# Patient Record
Sex: Male | Born: 1945 | Race: Black or African American | Hispanic: No | Marital: Married | Smoking: Never smoker
Health system: Southern US, Community
[De-identification: ages and names within clinical notes are randomized; demographics above are authoritative.]

## PROBLEM LIST (undated history)

## (undated) DIAGNOSIS — E119 Type 2 diabetes mellitus without complications: Secondary | ICD-10-CM

## (undated) DIAGNOSIS — N289 Disorder of kidney and ureter, unspecified: Secondary | ICD-10-CM

## (undated) DIAGNOSIS — I10 Essential (primary) hypertension: Secondary | ICD-10-CM

---

## 2000-12-07 ENCOUNTER — Ambulatory Visit (HOSPITAL_COMMUNITY): Admission: RE | Admit: 2000-12-07 | Discharge: 2000-12-07 | Payer: Self-pay | Admitting: Ophthalmology

## 2000-12-07 ENCOUNTER — Encounter: Payer: Self-pay | Admitting: Ophthalmology

## 2009-03-17 ENCOUNTER — Emergency Department (HOSPITAL_BASED_OUTPATIENT_CLINIC_OR_DEPARTMENT_OTHER): Admission: EM | Admit: 2009-03-17 | Discharge: 2009-03-17 | Payer: Self-pay | Admitting: Emergency Medicine

## 2010-08-02 NOTE — Op Note (Signed)
[place]. [facility]  Patient:    Colton Peterson, Colton Peterson Visit Number: [id number] MRN: [id number]          Service Type: Attending:  Ernesto Rutherford, M.D. Dictated by:   Ernesto Rutherford, M.D. Proc. Date: 12/07/00                             Operative Report  PREOPERATIVE DIAGNOSES: 1. Dense vitreous hemorrhage, left eye. 2. 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  POSTOPERATIVE DIAGNOSES: 1. Dense vitreous hemorrhage, left eye. 2. 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  PROCEDURE:  Posterior vitrectomy with Endolaser panphotocoagulation, left eye.  SURGEON:  Ernesto Rutherford, M.D.  ANESTHESIA:  General endotracheal anesthesia.  INDICATION FOR PROCEDURE:  A 65 year old man who has had progressive proliferative diabetic retinopathy which has led to vitreous hemorrhage which is not clearing.  This is an attempt to clear vitreous opacification so as to allow for maximal visual acuity recovery and reinstitution of visual functioning.  The patient understands the risks of anesthesia, including the rare occurrence of death, and loss to the eye, including hemorrhage, infection, scarring, need for further surgery, no change in vision, loss of vision, and progression of disease despite intervention.  An appropriate signed consent was obtained.  DESCRIPTION OF PROCEDURE:  The patient was taken to the operating room. Planned local retrobulbar anesthesia with monitored anesthesia control was started; however, the patient became somnolent and had low respiratory rate, and thus nasal airway placed followed by a decision to switch to general endotracheal anesthesia for safety.  This was carried out without difficulty. The left periocular region was sterilely prepped and draped in the usual ophthalmic fashion.  Lid speculum applied.  Conjunctival peritomy fashioned temporally and superonasally.  A 4 mm infusion was  secured 4 mm posterior to the limbus in the inferotemporal quadrant.  Placement in the vitreous cavity verified visually.  Superior sclerotomy was then fashioned.  Wall microscope placed in position with BIOM attached.  Core vitrectomy was then begun. Vitreous was engaged and dense vitreous hemorrhage removed.  Neovascular tissue was noted temporally, and this was excised as well with vitrectomy instrumentation.  Vitreous skirt elevated 360 degrees.  Vitreous opacification removed.  Preretinal hemorrhage was aspirated.  Endolaser photocoagulation was then carried out posteriorly and peripherally.  Hemostasis was otherwise spontaneous.  The instruments were removed from the eye.  The superior sclerotomies were closed with 7-0 Vicryl suture.  The infusion was removed and the sclerotomy closed with 7-0 Vicryl suture.  Subconjunctival injections were placed inferiorly after the conjunctiva had been closed with 7-0 Vicryl suture.  Sterile patch and Fox shield applied.  The patient awakened from anesthesia and taken to the recovery room in good and stable condition. Dictated by:   Ernesto Rutherford, M.D. Attending:  Ernesto Rutherford, M.D. DD:  12/07/00 TD:  12/08/00 Job: [id number]

## 2018-09-01 ENCOUNTER — Encounter (HOSPITAL_BASED_OUTPATIENT_CLINIC_OR_DEPARTMENT_OTHER): Payer: Self-pay | Admitting: Student

## 2018-09-01 ENCOUNTER — Emergency Department (HOSPITAL_BASED_OUTPATIENT_CLINIC_OR_DEPARTMENT_OTHER)
Admission: EM | Admit: 2018-09-01 | Discharge: 2018-09-01 | Disposition: A | Discharge status: Home / Self Care | Payer: Medicare PPO | Attending: Emergency Medicine | Admitting: Emergency Medicine

## 2018-09-01 ENCOUNTER — Other Ambulatory Visit: Payer: Self-pay

## 2018-09-01 ENCOUNTER — Emergency Department (HOSPITAL_BASED_OUTPATIENT_CLINIC_OR_DEPARTMENT_OTHER): Payer: Medicare PPO

## 2018-09-01 DIAGNOSIS — R51 Headache: Secondary | ICD-10-CM | POA: Insufficient documentation

## 2018-09-01 DIAGNOSIS — W19XXXA Unspecified fall, initial encounter: Secondary | ICD-10-CM

## 2018-09-01 DIAGNOSIS — Y999 Unspecified external cause status: Secondary | ICD-10-CM | POA: Insufficient documentation

## 2018-09-01 DIAGNOSIS — S8992XA Unspecified injury of left lower leg, initial encounter: Secondary | ICD-10-CM | POA: Diagnosis present

## 2018-09-01 DIAGNOSIS — W109XXA Fall (on) (from) unspecified stairs and steps, initial encounter: Secondary | ICD-10-CM | POA: Diagnosis not present

## 2018-09-01 DIAGNOSIS — M25552 Pain in left hip: Secondary | ICD-10-CM | POA: Insufficient documentation

## 2018-09-01 DIAGNOSIS — I10 Essential (primary) hypertension: Secondary | ICD-10-CM | POA: Insufficient documentation

## 2018-09-01 DIAGNOSIS — E119 Type 2 diabetes mellitus without complications: Secondary | ICD-10-CM | POA: Diagnosis not present

## 2018-09-01 DIAGNOSIS — Y9301 Activity, walking, marching and hiking: Secondary | ICD-10-CM | POA: Insufficient documentation

## 2018-09-01 DIAGNOSIS — Z23 Encounter for immunization: Secondary | ICD-10-CM | POA: Insufficient documentation

## 2018-09-01 DIAGNOSIS — S80212A Abrasion, left knee, initial encounter: Secondary | ICD-10-CM | POA: Insufficient documentation

## 2018-09-01 DIAGNOSIS — Y929 Unspecified place or not applicable: Secondary | ICD-10-CM | POA: Insufficient documentation

## 2018-09-01 HISTORY — DX: Disorder of kidney and ureter, unspecified: N28.9

## 2018-09-01 HISTORY — DX: Type 2 diabetes mellitus without complications: E11.9

## 2018-09-01 HISTORY — DX: Essential (primary) hypertension: I10

## 2018-09-01 MED ORDER — TETANUS-DIPHTH-ACELL PERTUSSIS 5-2.5-18.5 LF-MCG/0.5 IM SUSP
0.5000 mL | Freq: Once | INTRAMUSCULAR | Status: AC
  Administered 2018-09-01: 0.5 mL via INTRAMUSCULAR
  Filled 2018-09-01: qty 0.5

## 2018-09-01 MED ORDER — OXYCODONE-ACETAMINOPHEN 5-325 MG PO TABS
1.0000 | ORAL_TABLET | Freq: Once | ORAL | Status: AC
  Administered 2018-09-01: 1 via ORAL
  Filled 2018-09-01: qty 1

## 2018-09-01 NOTE — Discharge Instructions (Signed)
Please read and follow all provided instructions.  You have been seen today after a fall.  The CT scan of your head did not show a bleed or other acute abnormality.  The x-ray of your hips and knees did not show fracture or dislocation.  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  Home instructions: -- *PRICE in the first 24-48 hours after injury: Protect (with brace, splint, sling), if given by your provider Rest Ice- Do not apply ice pack directly to your skin, place towel or similar between your skin and ice/ice pack. Apply ice for 20 min, then remove for 40 min while awake Compression- Wear brace, elastic bandage, splint as directed by your provider Elevate affected extremity above the level of your heart when not walking around for the first 24-48 hours   Medications:  Please take Tylenol per over-the-counter dosing Follow-up instructions: Please follow-up with your primary care provider or the provided orthopedic physician (bone specialist) if you continue to have significant pain in 1 week. In this case you may have a more severe injury that requires further care.   Return instructions:  Please return if your digits or extremity are numb or tingling, appear gray or blue, or you have severe pain (also elevate the extremity and loosen splint or wrap if you were given one) Please return if you have redness or fevers.  Please return to the Emergency Department if you experience worsening symptoms.  Please return if you have any other emergent concerns. Additional Information:  Your vital signs today were: BP (!) 108/51 (BP Location: Left Arm)    Pulse (!) 57    Temp 98.8 F (37.1 C) (Oral)    Resp 18    Ht 5\' 8"  (1.727 m)    Wt 122.5 kg    SpO2 99%    BMI 41.05 kg/m  If your blood pressure (BP) was elevated above 135/85 this visit, please have this repeated by your doctor within one month. ---------------

## 2018-09-01 NOTE — ED Triage Notes (Signed)
Per [facility] patient c/o mechanical fall of three steps at house. Patient denies LOC or injury to head/neck/back. Patient does endorse pain lin left hip and side at 4/10. Patient is AO x 4 with VS WDL.

## 2018-09-01 NOTE — ED Provider Notes (Signed)
[facility] EMERGENCY DEPARTMENT Provider Note   CSN: [id number] Arrival date & time: 09/01/18  1937     History   Chief Complaint Chief Complaint  Patient presents with  . Fall    HPI Colton Peterson is a 73 y.o. male with a history of type 2 diabetes mellitus, hypertension, and hyperlipidemia who presents to the emergency department via EMS status post mechanical fall shortly prior to arrival with complaints of left hip pain.  Patient states he was walking down the steps too quickly when he misstepped resulting in a fall.  He reports head injury without loss of consciousness.  He notes he is having pain to the left hip as well as to the left knee where there is a skin abrasion.  His current discomfort is a 4 out of 10 in severity.  Worse with movement.  No alleviating factors.  He did not attempt to get up after the fall, has not ambulated since.  He denies headache, neck pain, back pain, nausea, vomiting, syncope, change in vision, numbness, or weakness.  He is not on anticoagulation.  He denies prodromal symptoms, no chest pain, dyspnea, lightheadedness, or dizziness, states this was purely mechanical.  He does not walk with an assistive device, he lives at home with his significant other.  Unknown last tetanus.    HPI  History reviewed. No pertinent past medical history.  There are no active problems to display for this patient.   History reviewed. No pertinent surgical history.      Home Medications    Prior to Admission medications   Not on File    Family History History reviewed. No pertinent family history.  Social History Social History   Tobacco Use  . Smoking status: Not on file  Substance Use Topics  . Alcohol use: Not on file  . Drug use: Not on file     Allergies   Penicillins   Review of Systems Review of Systems  Constitutional: Negative for chills and fever.  Eyes: Negative for visual disturbance.  Respiratory: Negative for  shortness of breath.   Cardiovascular: Negative for chest pain.  Gastrointestinal: Negative for nausea and vomiting.  Musculoskeletal: Positive for arthralgias. Negative for back pain and neck pain.  Skin: Positive for wound.  Neurological: Negative for dizziness, seizures, syncope, facial asymmetry, weakness, light-headedness, numbness and headaches.  All other systems reviewed and are negative.    Physical Exam Updated Vital Signs BP (!) 108/51 (BP Location: Left Arm)   Pulse (!) 57   Temp 98.8 F (37.1 C) (Oral)   Resp 18   Ht 5\' 8"  (1.727 m)   Wt 122.5 kg   SpO2 99%   BMI 41.05 kg/m   Physical Exam Vitals signs and nursing note reviewed.  Constitutional:      General: He is not in acute distress.    Appearance: He is well-developed. He is not toxic-appearing.  HENT:     Head: Normocephalic and atraumatic. No raccoon eyes or Battle's sign.     Right Ear: No hemotympanum.     Left Ear: No hemotympanum.     Nose: Nose normal.     Mouth/Throat:     Pharynx: Uvula midline.  Eyes:     General:        Right eye: No discharge.        Left eye: No discharge.     Extraocular Movements: Extraocular movements intact.     Conjunctiva/sclera: Conjunctivae normal.  Pupils: Pupils are equal, round, and reactive to light.  Neck:     Musculoskeletal: Normal range of motion and neck supple. No spinous process tenderness.  Cardiovascular:     Rate and Rhythm: Normal rate and regular rhythm.     Pulses:          Dorsalis pedis pulses are 2+ on the right side and 2+ on the left side.  Pulmonary:     Effort: Pulmonary effort is normal. No respiratory distress.     Breath sounds: Normal breath sounds. No wheezing, rhonchi or rales.  Chest:     Chest wall: No tenderness.  Abdominal:     General: There is no distension.     Palpations: Abdomen is soft.     Tenderness: There is no abdominal tenderness. There is no guarding or rebound.  Musculoskeletal:     Comments: Upper  extremities: Normal active range of motion without point/focal bony tenderness Back: No midline tenderness to palpation or palpable step-off Lower extremities: Patient noted to have symmetric 1-2+ pitting edema to the bilateral lower legs which patient states is baseline for him.  He is also noted to have an abrasion to the left anterior knee without active bleeding, no deep laceration wounds.  He has no obvious deformity or ecchymosis.  He has intact active range of motion to bilateral hips, knees, and ankles.  Tender to palpation to the left lateral hip in the area of the iliac crest as well as tender to the left anterior knee over the patella.  Lower extremities are otherwise nontender neurovascularly intact distally.  Skin:    General: Skin is warm and dry.     Findings: No rash.  Neurological:     Mental Status: He is alert.     Comments: Clear speech.  CN III through XII grossly intact.  Sensation grossly intact bilateral upper and lower extremities.  5 out of 5 symmetric grip strength.  5 out of 5 strength with plantar dorsiflexion bilaterally.  Trial of ambulation deferred on initial assessment.  Psychiatric:        Behavior: Behavior normal.    ED Treatments / Results  Labs (all labs ordered are listed, but only abnormal results are displayed) Labs Reviewed - No data to display  EKG    Radiology Ct Head Wo Contrast  Result Date: 09/01/2018 CLINICAL DATA:  Head trauma, headache EXAM: CT HEAD WITHOUT CONTRAST TECHNIQUE: Contiguous axial images were obtained from the base of the skull through the vertex without intravenous contrast. COMPARISON:  None. FINDINGS: Brain: No acute territorial infarction, hemorrhage or intracranial mass. The ventricles are nonenlarged. Moderate atrophy. Mild small vessel ischemic changes of the white matter. Vascular: No hyperdense vessels.  Carotid vascular calcification Skull: Normal. Negative for fracture or focal lesion. Sinuses/Orbits: No acute finding.  Mild mucosal thickening in the right maxillary sinus Other: None IMPRESSION: 1. No CT evidence for acute intracranial abnormality. 2. Atrophy and minimal small vessel ischemic changes of the white matter Electronically Signed   By: Donavan Foil M.D.   On: 09/01/2018 21:15   Dg Knee Complete 4 Views Left  Result Date: 09/01/2018 CLINICAL DATA:  Fall with knee pain EXAM: LEFT KNEE - COMPLETE 4+ VIEW COMPARISON:  None. FINDINGS: No acute displaced fracture or malalignment. No large knee effusion. Vascular calcifications. Mild degenerative change of the medial joint space. IMPRESSION: No acute osseous abnormality. Electronically Signed   By: Donavan Foil M.D.   On: 09/01/2018 21:17   Dg Hip  Unilat With Pelvis 2-3 Views Left  Result Date: 09/01/2018 CLINICAL DATA:  Mechanical fall EXAM: DG HIP (WITH OR WITHOUT PELVIS) 2-3V LEFT COMPARISON:  None. FINDINGS: Moderate fecal debris in the rectum. Pubic symphysis and rami are intact. Moderate arthritis of both hips. No fracture or malalignment. IMPRESSION: 1. No acute osseous abnormality. 2. Moderate arthritis of both hips. Electronically Signed   By: Jasmine PangKim  Fujinaga M.D.   On: 09/01/2018 21:16    Procedures Procedures (including critical care time)  Medications Ordered in ED Medications  oxyCODONE-acetaminophen (PERCOCET/ROXICET) 5-325 MG per tablet 1 tablet (1 tablet Oral Given 09/01/18 2028)  Tdap (BOOSTRIX) injection 0.5 mL (0.5 mLs Intramuscular Given 09/01/18 2132)     Initial Impression / Assessment and Plan / ED Course  I have reviewed the triage vital signs and the nursing notes.  Pertinent labs & imaging results that were available during my care of the patient were reviewed by me and considered in my medical decision making (see chart for details).   Patient presents to the emergency department status post mechanical fall, no prodromal symptoms, with complaints of left hip and knee pain.  Patient nontoxic-appearing, no apparent distress,  vitals without significant abnormality.  He does not appear to have serious head, neck, or back injury.  He is requesting a CT of his head which was obtained and negative for acute intracranial abnormality.  No midline spinal tenderness or focal neurologic deficits.  No chest or abdominal tenderness to indicate acute intrathoracic/abdominal/pelvic trauma.  He does have tenderness to the lateral aspect of the left hip in the anterior left knee, left knee with small skin abrasion that does not appear to require closure-tetanus updated, good range of motion to each of these joints, neurovascularly intact distally.  Plain films negative for fracture or dislocation.  Hip arthritis present bilaterally. Patient able to ambulate & bear weight bilaterally. Will discharge home with instructions for PRICE & tylenol PRN pain. I discussed results, treatment plan, need for follow-up, and return precautions with the patient. Provided opportunity for questions, patient confirmed understanding and is in agreement with plan.   Findings and plan of care discussed with supervising physician Dr. Anitra LauthPlunkett who evaluated patient & is in agreement.   Final Clinical Impressions(s) / ED Diagnoses   Final diagnoses:  Fall, initial encounter    ED Discharge Orders    None       Desmond Lopeetrucelli, Terral Cooks R, PA-C 09/01/18 2154    Gwyneth SproutPlunkett, Whitney, MD 09/01/18 2244

## 2019-04-30 ENCOUNTER — Ambulatory Visit: Payer: Medicare PPO | Attending: Internal Medicine

## 2019-04-30 DIAGNOSIS — Z23 Encounter for immunization: Secondary | ICD-10-CM

## 2019-04-30 NOTE — Progress Notes (Signed)
   Covid-19 Vaccination Clinic  Name:  Colton Peterson    MRN: [id number] DOB: Aug 14, 1945  04/30/2019  [name] was observed post Covid-19 immunization for 15 minutes without incidence. He was provided with Vaccine Information Sheet and instruction to access the V-Safe system.   [name] was instructed to call 911 with any severe reactions post vaccine: Marland Kitchen Difficulty breathing  . Swelling of your face and throat  . A fast heartbeat  . A bad rash all over your body  . Dizziness and weakness    Immunizations Administered    Name Date Dose VIS Date Route   Pfizer COVID-19 Vaccine 04/30/2019  9:40 AM 0.3 mL 02/25/2019 Intramuscular   Manufacturer: [employer], [employer]   Lot: [id number]   NDC: [address]-3

## 2019-05-23 ENCOUNTER — Ambulatory Visit: Payer: Medicare PPO | Attending: Internal Medicine

## 2019-05-23 DIAGNOSIS — Z23 Encounter for immunization: Secondary | ICD-10-CM

## 2019-05-23 NOTE — Progress Notes (Signed)
   Covid-19 Vaccination Clinic  Name:  Colton Peterson    MRN: [id number] DOB: [birth year]  05/23/2019  Colton Peterson was observed post Covid-19 immunization for 15 minutes without incident. He was provided with Vaccine Information Sheet and instruction to access the V-Safe system.   Colton Peterson was instructed to call 911 with any severe reactions post vaccine: Marland Kitchen Difficulty breathing  . Swelling of face and throat  . A fast heartbeat  . A bad rash all over body  . Dizziness and weakness   Immunizations Administered    Name Date Dose VIS Date Route   Pfizer COVID-19 Vaccine 05/23/2019 10:41 AM 0.3 mL 02/25/2019 Intramuscular   Manufacturer: [employer], [employer]   Lot: [id number]   NDC: [address]-1

## 2019-09-05 ENCOUNTER — Encounter (INDEPENDENT_AMBULATORY_CARE_PROVIDER_SITE_OTHER): Payer: Medicare PPO | Admitting: Ophthalmology

## 2019-10-17 ENCOUNTER — Encounter (INDEPENDENT_AMBULATORY_CARE_PROVIDER_SITE_OTHER): Payer: Medicare PPO | Admitting: Ophthalmology

## 2020-04-25 IMAGING — DX LEFT KNEE - COMPLETE 4+ VIEW
4 series · 4 of 4 positions shown · non-contrast
Comparison: None.

CLINICAL DATA: Fall with knee pain

EXAM:
LEFT KNEE - COMPLETE 4+ VIEW

[knee ap]
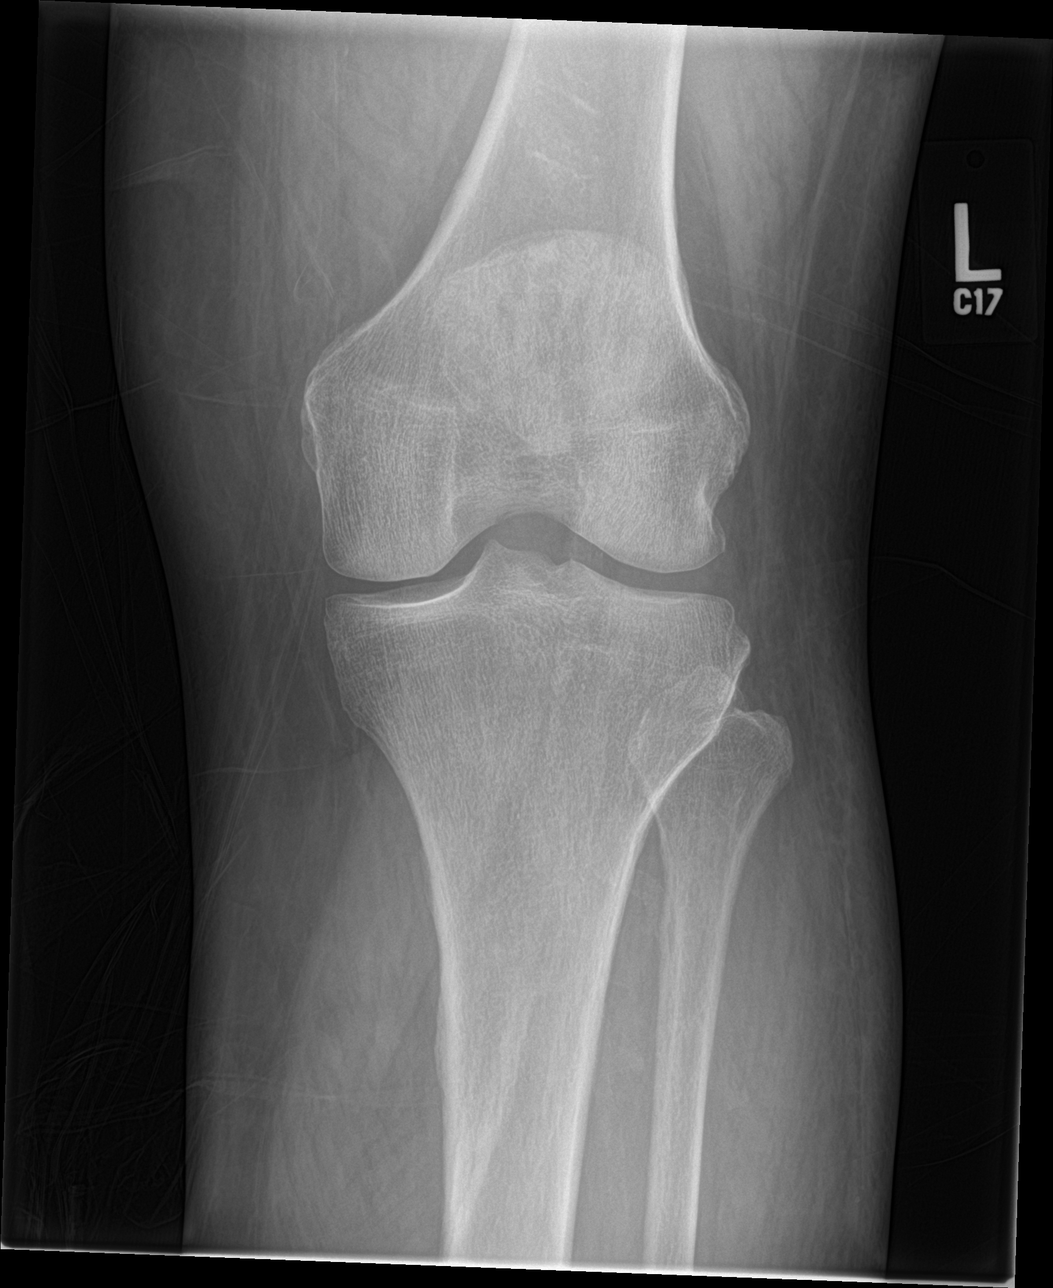

[knee lat]
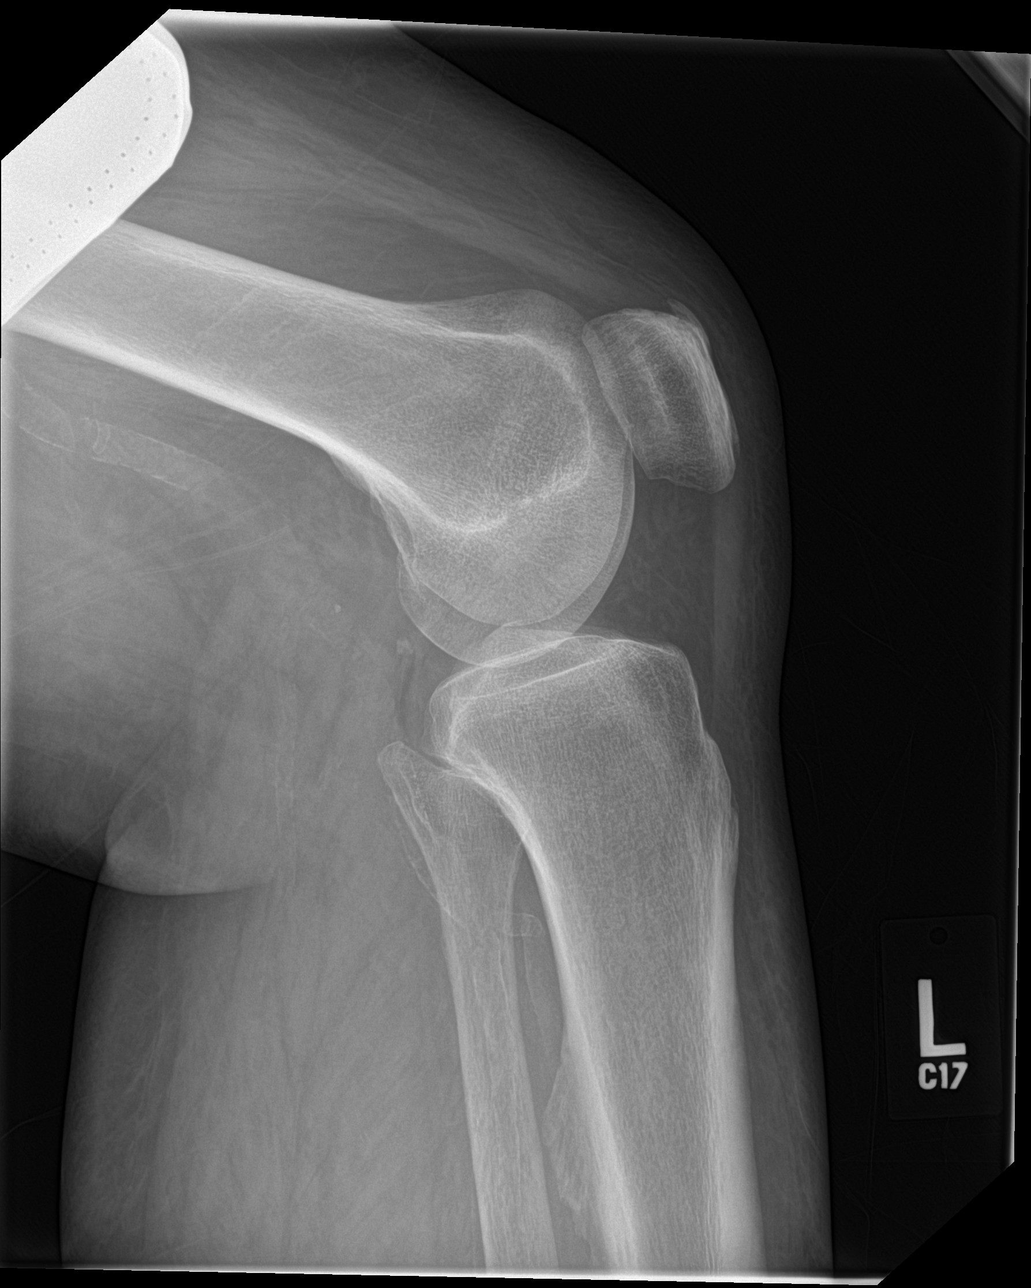

[knee obl (1 of 2)]
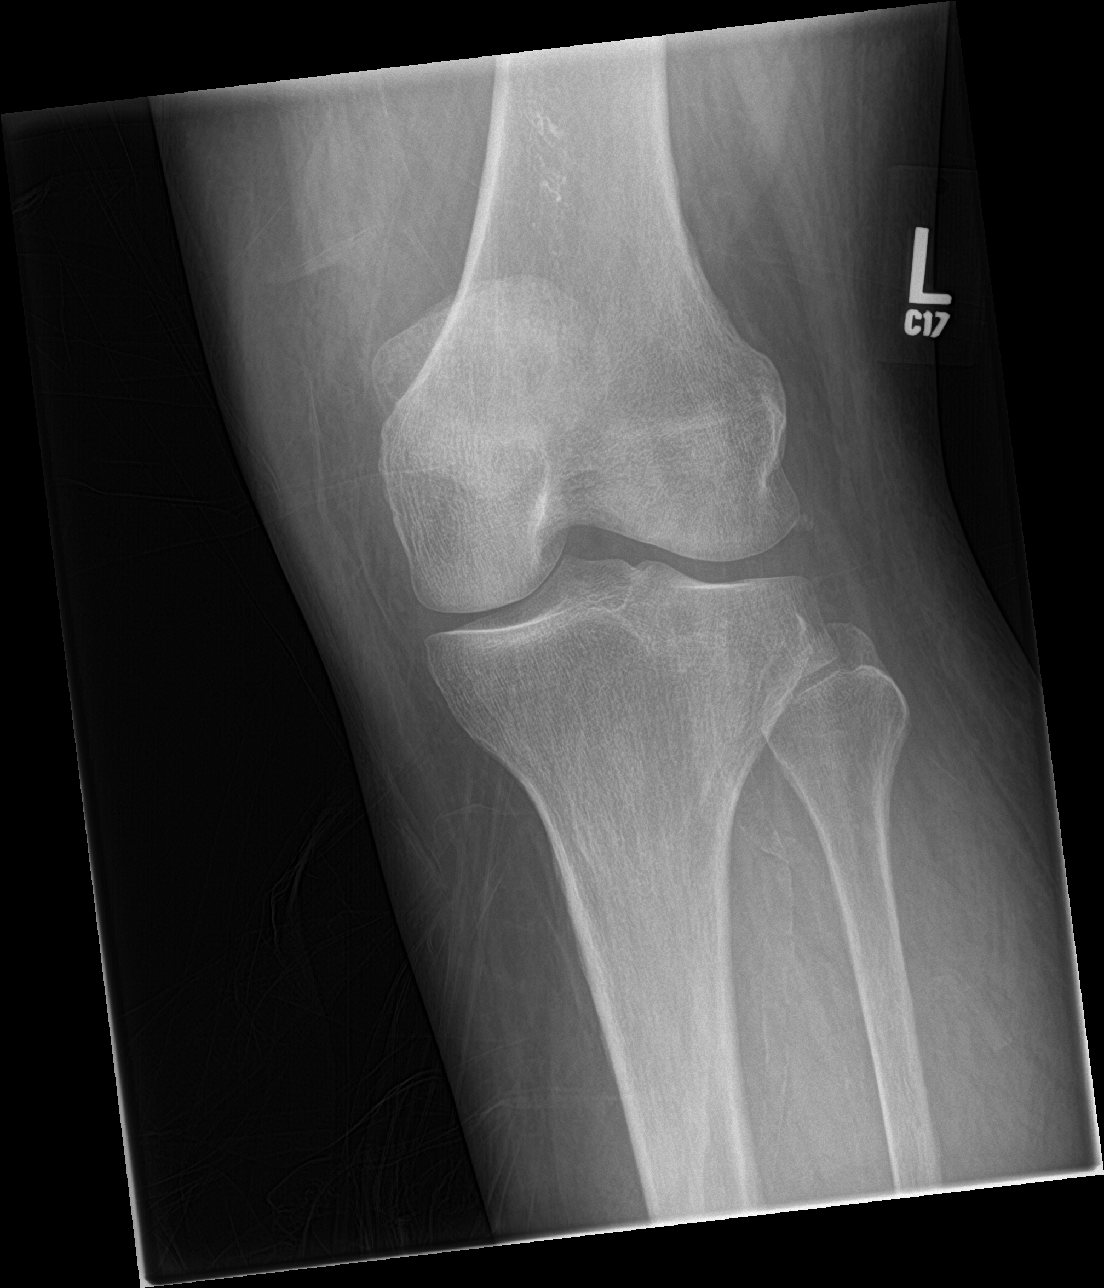

[knee obl (2 of 2)]
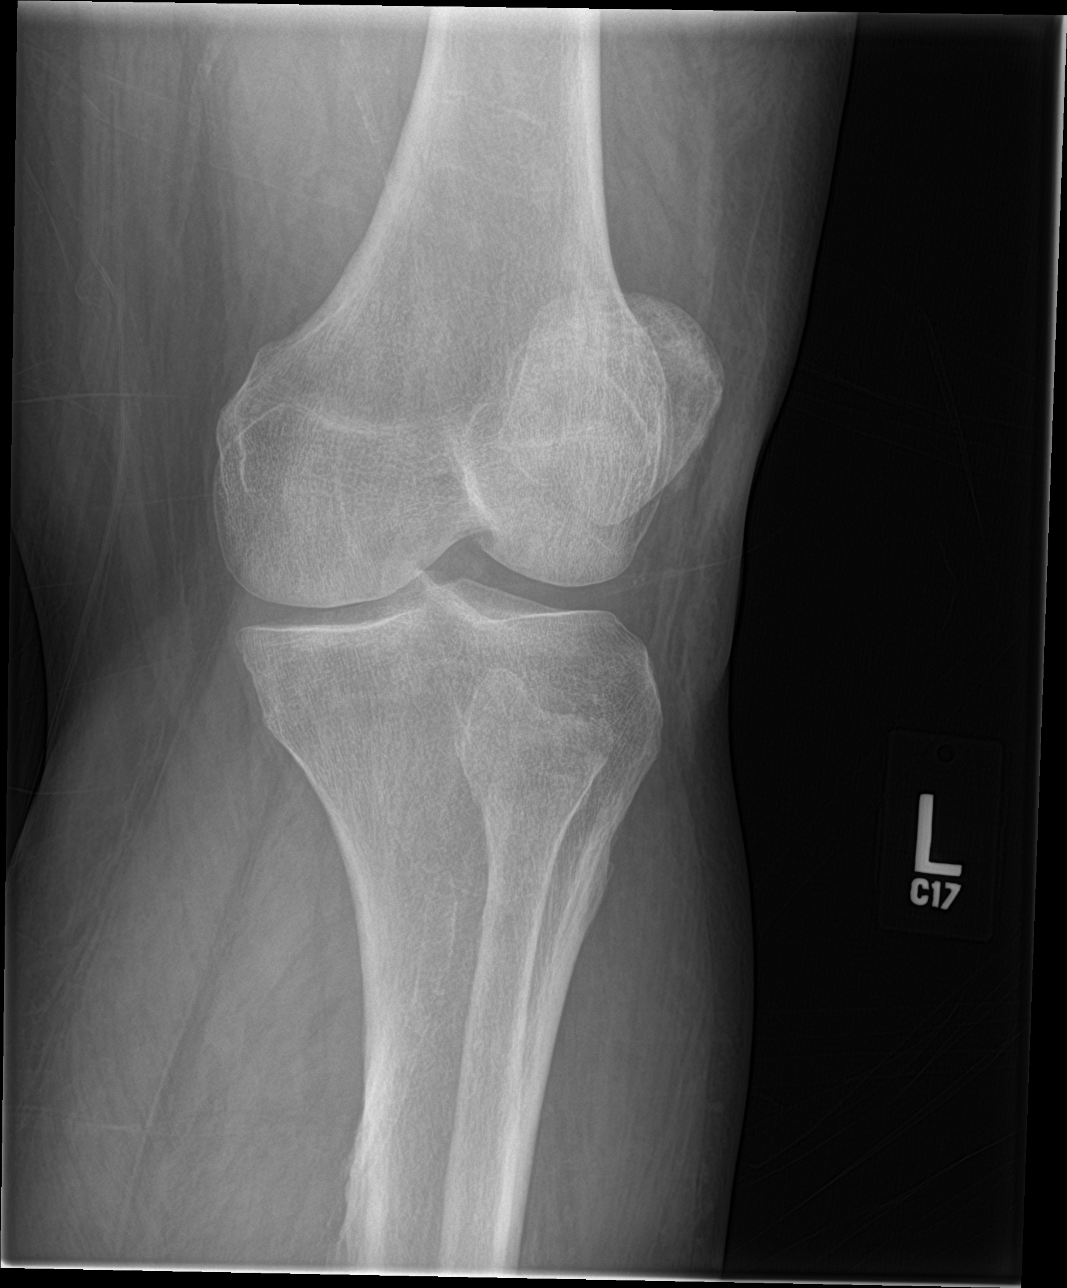

[4 of 4 positions shown; findings below may reference images not displayed]

FINDINGS: No acute displaced fracture or malalignment. No large knee effusion.
Vascular calcifications. Mild degenerative change of the medial
joint space.
IMPRESSION: No acute osseous abnormality.

## 2020-10-15 ENCOUNTER — Ambulatory Visit (INDEPENDENT_AMBULATORY_CARE_PROVIDER_SITE_OTHER): Payer: Medicare PPO | Admitting: Ophthalmology

## 2020-10-15 ENCOUNTER — Encounter (INDEPENDENT_AMBULATORY_CARE_PROVIDER_SITE_OTHER): Payer: Self-pay | Admitting: Ophthalmology

## 2020-10-15 ENCOUNTER — Other Ambulatory Visit: Payer: Self-pay

## 2020-10-15 DIAGNOSIS — E113553 Type 2 diabetes mellitus with stable proliferative diabetic retinopathy, bilateral: Secondary | ICD-10-CM | POA: Diagnosis not present

## 2020-10-15 DIAGNOSIS — Z794 Long term (current) use of insulin: Secondary | ICD-10-CM | POA: Diagnosis not present

## 2020-10-15 NOTE — Progress Notes (Signed)
10/15/2020     CHIEF COMPLAINT Patient presents for Retina Follow Up (2 Yr follow up OCT OU (last seen 11/2018)/Last A1C 7.1. BS 90 something per patient./Patient states vision is stable and unchanged. No new FOL or floaters.)   HISTORY OF PRESENT ILLNESS: Colton Peterson is a 75 y.o. male who presents to the clinic today for:   HPI     Retina Follow Up           Diagnosis: Other   Laterality: both eyes   Onset: 2 years ago   Severity: mild   Duration: 2 years   Course: stable   Comments: 2 Yr follow up OCT OU (last seen 11/2018) Last A1C 7.1. BS 90 something per patient. Patient states vision is stable and unchanged. No new FOL or floaters.       Last edited by Nelva Nay, COA on 10/15/2020  8:49 AM.      Referring physician: No referring provider defined for this encounter.  HISTORICAL INFORMATION:   Selected notes from the MEDICAL RECORD NUMBER       CURRENT MEDICATIONS: No current outpatient medications on file. (Ophthalmic Drugs)   No current facility-administered medications for this visit. (Ophthalmic Drugs)   Current Outpatient Medications (Other)  Medication Sig   aspirin 325 MG EC tablet Take 325 mg by mouth daily.   insulin aspart (NOVOLOG) 100 UNIT/ML injection Inject 10-15 Units into the skin 3 (three) times daily before meals.   potassium chloride SA (K-DUR) 20 MEQ tablet Take 20 mEq by mouth 2 (two) times daily.   quinapril (ACCUPRIL) 20 MG tablet Take 20 mg by mouth at bedtime.   spironolactone (ALDACTONE) 50 MG tablet Take 50 mg by mouth daily.   torsemide (DEMADEX) 20 MG tablet Take 20 mg by mouth daily.   No current facility-administered medications for this visit. (Other)      REVIEW OF SYSTEMS:    ALLERGIES Allergies  Allergen Reactions   Penicillins Hives    PAST MEDICAL HISTORY Past Medical History:  Diagnosis Date   Diabetes mellitus without complication (HCC)    Hypertension    Renal disorder    History reviewed.  No pertinent surgical history.  FAMILY HISTORY History reviewed. No pertinent family history.  SOCIAL HISTORY Social History   Tobacco Use   Smoking status: Never   Smokeless tobacco: Former    Types: Chew, Snuff  Vaping Use   Vaping Use: Never used  Substance Use Topics   Alcohol use: Not Currently   Drug use: Never         OPHTHALMIC EXAM:  Base Eye Exam     Visual Acuity (ETDRS)       Right Left   Dist [place] 20/50 +2 20/30 -2   Dist ph [place] 20/40 -2 20/25 -2         Tonometry (Tonopen, 8:54 AM)       Right Left   Pressure 10 12         Pupils       Pupils Dark Light Shape React APD   Right PERRL 4 4 Round Minimal None   Left PERRL 2 2 Round Minimal None         Visual Fields (Counting fingers)       Left Right    Full Full         Extraocular Movement       Right Left    Full Full  Neuro/Psych     Oriented x3: Yes   Mood/Affect: Normal         Dilation     Both eyes: 1.0% Mydriacyl, 2.5% Phenylephrine @ 8:55 AM           Slit Lamp and Fundus Exam     External Exam       Right Left   External Normal Normal         Slit Lamp Exam       Right Left   Lids/Lashes Normal Normal   Conjunctiva/Sclera White and quiet White and quiet   Cornea Clear Clear   Anterior Chamber Deep and quiet Deep and quiet   Iris Round and reactive Round and reactive   Lens Posterior chamber intraocular lens Posterior chamber intraocular lens   Anterior Vitreous Normal Normal         Fundus Exam       Right Left   Posterior Vitreous Normal Normal   Disc Normal Normal   C/D Ratio 0.4 0.4   Macula Normal Normal   Vessels Normal Normal   Periphery Normal Normal            IMAGING AND PROCEDURES  Imaging and Procedures for 10/15/20  OCT, Retina - OU - Both Eyes       Right Eye Quality was good. Scan locations included subfoveal. Central Foveal Thickness: 261. Progression has been stable. Findings include abnormal  foveal contour.   Left Eye Quality was good. Scan locations included subfoveal. Central Foveal Thickness: 258. Progression has been stable. Findings include abnormal foveal contour.   Notes Bilateral old CSME with good focal laser treatment temporally, no active CSME OU.  Will observe             ASSESSMENT/PLAN:  Controlled type 2 diabetes mellitus with stable proliferative retinopathy of both eyes, with long-term current use of insulin (HCC) The nature of regressed proliferative diabetic retinopathy was discussed with the patient. The patient was advised to maintain good glucose, blood pressure, monitor kidney function and serum lipid control as advised by personal physician. Rare risk for reactivation of progression exist with untreated severe anemia, untreated renal failure, untreated heart failure, and smoking. Complete avoidance of smoking was recommended. The chance of recurrent proliferative diabetic retinopathy was discussed as well as the chance of vitreous hemorrhage for which further treatments may be necessary.   Explained to the patient that the quiescent  proliferative diabetic retinopathy disease is unlikely to ever worsen.  Worsening factors would include however severe anemia, hypertension out-of-control or impending renal failure.      ICD-10-CM   1. Controlled type 2 diabetes mellitus with stable proliferative retinopathy of both eyes, with long-term current use of insulin (HCC)  E11.3553 OCT, Retina - OU - Both Eyes   Z79.4       1.  2.  3.  Ophthalmic Meds Ordered this visit:  No orders of the defined types were placed in this encounter.      Return in about 9 months (around 07/15/2021) for COLOR FP, OCT.  There are no Patient Instructions on file for this visit.   Explained the diagnoses, plan, and follow up with the patient and they expressed understanding.  Patient expressed understanding of the importance of proper follow up care.   Alford Highland  Ayaka Andes M.D. Diseases & Surgery of the Retina and Vitreous [facility] 10/15/20     Abbreviations: M myopia (nearsighted); A astigmatism; H hyperopia (farsighted); P  presbyopia; Mrx spectacle prescription;  CTL contact lenses; OD right eye; OS left eye; OU both eyes  XT exotropia; ET esotropia; PEK punctate epithelial keratitis; PEE punctate epithelial erosions; DES dry eye syndrome; MGD meibomian gland dysfunction; ATs artificial tears; PFAT's preservative free artificial tears; NSC nuclear sclerotic cataract; PSC posterior subcapsular cataract; ERM epi-retinal membrane; PVD posterior vitreous detachment; RD retinal detachment; DM diabetes mellitus; DR diabetic retinopathy; NPDR non-proliferative diabetic retinopathy; PDR proliferative diabetic retinopathy; CSME clinically significant macular edema; DME diabetic macular edema; dbh dot blot hemorrhages; CWS cotton wool spot; POAG primary open angle glaucoma; C/D cup-to-disc ratio; HVF humphrey visual field; GVF goldmann visual field; OCT optical coherence tomography; IOP intraocular pressure; BRVO Branch retinal vein occlusion; CRVO central retinal vein occlusion; CRAO central retinal artery occlusion; BRAO branch retinal artery occlusion; RT retinal tear; SB scleral buckle; PPV pars plana vitrectomy; VH Vitreous hemorrhage; PRP panretinal laser photocoagulation; IVK intravitreal kenalog; VMT vitreomacular traction; MH Macular hole;  NVD neovascularization of the disc; NVE neovascularization elsewhere; AREDS age related eye disease study; ARMD age related macular degeneration; POAG primary open angle glaucoma; EBMD epithelial/anterior basement membrane dystrophy; ACIOL anterior chamber intraocular lens; IOL intraocular lens; PCIOL posterior chamber intraocular lens; Phaco/IOL phacoemulsification with intraocular lens placement; PRK photorefractive keratectomy; LASIK laser assisted in situ keratomileusis; HTN hypertension; DM diabetes  mellitus; COPD chronic obstructive pulmonary disease

## 2020-10-15 NOTE — Assessment & Plan Note (Signed)

## 2021-07-15 ENCOUNTER — Encounter (INDEPENDENT_AMBULATORY_CARE_PROVIDER_SITE_OTHER): Payer: Medicare PPO | Admitting: Ophthalmology

## 2021-10-23 ENCOUNTER — Encounter (INDEPENDENT_AMBULATORY_CARE_PROVIDER_SITE_OTHER): Payer: Self-pay | Admitting: Ophthalmology

## 2021-10-23 ENCOUNTER — Ambulatory Visit (INDEPENDENT_AMBULATORY_CARE_PROVIDER_SITE_OTHER): Payer: Medicare PPO | Admitting: Ophthalmology

## 2021-10-23 DIAGNOSIS — Z794 Long term (current) use of insulin: Secondary | ICD-10-CM | POA: Diagnosis not present

## 2021-10-23 DIAGNOSIS — E113553 Type 2 diabetes mellitus with stable proliferative diabetic retinopathy, bilateral: Secondary | ICD-10-CM

## 2021-10-23 NOTE — Assessment & Plan Note (Signed)

## 2021-10-23 NOTE — Progress Notes (Signed)
10/23/2021     CHIEF COMPLAINT Patient presents for  Chief Complaint  Patient presents with   Retina Follow Up      HISTORY OF PRESENT ILLNESS: Colton Peterson is a 76 y.o. male who presents to the clinic today for:   HPI     Retina Follow Up           Diagnosis: Diabetic Retinopathy   Laterality: both eyes   Severity: moderate   Course: stable         Comments   1 YR FU OU OCT. Pt stated vision has been stable.  However, pt reported, "When I went to get my drivers license renewal, the administrator told me that I might've missed some things that suggested there has been changes in my vision but to me I haven't noticed any changes." Pt last A1C was 6.7       Last edited by Angeline Slim on 10/23/2021  9:43 AM.      Referring physician: No referring provider defined for this encounter.  HISTORICAL INFORMATION:   Selected notes from the MEDICAL RECORD NUMBER       CURRENT MEDICATIONS: No current outpatient medications on file. (Ophthalmic Drugs)   No current facility-administered medications for this visit. (Ophthalmic Drugs)   Current Outpatient Medications (Other)  Medication Sig   aspirin 325 MG EC tablet Take 325 mg by mouth daily.   insulin aspart (NOVOLOG) 100 UNIT/ML injection Inject 10-15 Units into the skin 3 (three) times daily before meals.   potassium chloride SA (K-DUR) 20 MEQ tablet Take 20 mEq by mouth 2 (two) times daily.   quinapril (ACCUPRIL) 20 MG tablet Take 20 mg by mouth at bedtime.   spironolactone (ALDACTONE) 50 MG tablet Take 50 mg by mouth daily.   torsemide (DEMADEX) 20 MG tablet Take 20 mg by mouth daily.   No current facility-administered medications for this visit. (Other)      REVIEW OF SYSTEMS: ROS   Negative for: Constitutional, Gastrointestinal, Neurological, Skin, Genitourinary, Musculoskeletal, HENT, Endocrine, Cardiovascular, Eyes, Respiratory, Psychiatric, Allergic/Imm, Heme/Lymph Last edited by Angeline Slim on 10/23/2021   9:42 AM.       ALLERGIES Allergies  Allergen Reactions   Penicillins Hives    PAST MEDICAL HISTORY Past Medical History:  Diagnosis Date   Diabetes mellitus without complication (HCC)    Hypertension    Renal disorder    History reviewed. No pertinent surgical history.  FAMILY HISTORY History reviewed. No pertinent family history.  SOCIAL HISTORY Social History   Tobacco Use   Smoking status: Never   Smokeless tobacco: Former    Types: Chew, Snuff  Vaping Use   Vaping Use: Never used  Substance Use Topics   Alcohol use: Not Currently   Drug use: Never         OPHTHALMIC EXAM:  Base Eye Exam     Visual Acuity (ETDRS)       Right Left   Dist [place] 20/40 20/30 +2   Dist ph [place] NI NI         Tonometry (Tonopen, 9:48 AM)       Right Left   Pressure 13 15         Pupils       Pupils APD   Right PERRL None   Left PERRL None         Visual Fields       Left Right    Full Full  Extraocular Movement       Right Left    Full, Ortho Full, Ortho         Neuro/Psych     Oriented x3: Yes   Mood/Affect: Normal         Dilation     Both eyes: 1.0% Mydriacyl, 2.5% Phenylephrine @ 9:48 AM           Slit Lamp and Fundus Exam     External Exam       Right Left   External Normal Normal         Slit Lamp Exam       Right Left   Lids/Lashes Normal Normal   Conjunctiva/Sclera White and quiet White and quiet   Cornea Clear Clear   Anterior Chamber Deep and quiet Deep and quiet   Iris Round and reactive Round and reactive   Lens Posterior chamber intraocular lens Posterior chamber intraocular lens   Anterior Vitreous Normal Normal         Fundus Exam       Right Left   Posterior Vitreous Clear and avitric Clear and avitric   Disc Normal Normal   C/D Ratio 0.4 0.4   Macula Focal laser scars, Microaneurysms, no clinically significant macular edema Focal laser scars, Microaneurysms, no clinically significant  macular edema   Vessels PDR-quiet PDR-quiet   Periphery Good PRP 360 Good PRP 360            IMAGING AND PROCEDURES  Imaging and Procedures for 10/23/21  OCT, Retina - OU - Both Eyes       Right Eye Quality was good. Scan locations included subfoveal. Central Foveal Thickness: 263. Progression has been stable. Findings include abnormal foveal contour.   Left Eye Quality was good. Scan locations included subfoveal. Central Foveal Thickness: 270. Progression has been stable. Findings include abnormal foveal contour.   Notes Bilateral old CSME with good focal laser treatment temporally, no active CSME OU.  Will observe             ASSESSMENT/PLAN:  Controlled type 2 diabetes mellitus with stable proliferative retinopathy of both eyes, with long-term current use of insulin (HCC) The nature of regressed proliferative diabetic retinopathy was discussed with the patient. The patient was advised to maintain good glucose, blood pressure, monitor kidney function and serum lipid control as advised by personal physician. Rare risk for reactivation of progression exist with untreated severe anemia, untreated renal failure, untreated heart failure, and smoking. Complete avoidance of smoking was recommended. The chance of recurrent proliferative diabetic retinopathy was discussed as well as the chance of vitreous hemorrhage for which further treatments may be necessary.   Explained to the patient that the quiescent  proliferative diabetic retinopathy disease is unlikely to ever worsen.  Worsening factors would include however severe anemia, hypertension out-of-control or impending renal failure.      ICD-10-CM   1. Controlled type 2 diabetes mellitus with stable proliferative retinopathy of both eyes, with long-term current use of insulin (HCC)  E11.3553 OCT, Retina - OU - Both Eyes   Z79.4       1.  OU, quiescent PDR.  No active maculopathy.  Excellent acuity.  2.  Patient  understands clinical importance of continued blood sugar control monitoring  3.  Ophthalmic Meds Ordered this visit:  No orders of the defined types were placed in this encounter.      Return in about 1 year (around 10/24/2022) for DILATE OU, COLOR FP, OCT.  There are no Patient Instructions on file for this visit.   Explained the diagnoses, plan, and follow up with the patient and they expressed understanding.  Patient expressed understanding of the importance of proper follow up care.   Alford Highland Jah Alarid M.D. Diseases & Surgery of the Retina and Vitreous [facility] 10/23/21     Abbreviations: M myopia (nearsighted); A astigmatism; H hyperopia (farsighted); P presbyopia; Mrx spectacle prescription;  CTL contact lenses; OD right eye; OS left eye; OU both eyes  XT exotropia; ET esotropia; PEK punctate epithelial keratitis; PEE punctate epithelial erosions; DES dry eye syndrome; MGD meibomian gland dysfunction; ATs artificial tears; PFAT's preservative free artificial tears; NSC nuclear sclerotic cataract; PSC posterior subcapsular cataract; ERM epi-retinal membrane; PVD posterior vitreous detachment; RD retinal detachment; DM diabetes mellitus; DR diabetic retinopathy; NPDR non-proliferative diabetic retinopathy; PDR proliferative diabetic retinopathy; CSME clinically significant macular edema; DME diabetic macular edema; dbh dot blot hemorrhages; CWS cotton wool spot; POAG primary open angle glaucoma; C/D cup-to-disc ratio; HVF humphrey visual field; GVF goldmann visual field; OCT optical coherence tomography; IOP intraocular pressure; BRVO Branch retinal vein occlusion; CRVO central retinal vein occlusion; CRAO central retinal artery occlusion; BRAO branch retinal artery occlusion; RT retinal tear; SB scleral buckle; PPV pars plana vitrectomy; VH Vitreous hemorrhage; PRP panretinal laser photocoagulation; IVK intravitreal kenalog; VMT vitreomacular traction; MH Macular hole;   NVD neovascularization of the disc; NVE neovascularization elsewhere; AREDS age related eye disease study; ARMD age related macular degeneration; POAG primary open angle glaucoma; EBMD epithelial/anterior basement membrane dystrophy; ACIOL anterior chamber intraocular lens; IOL intraocular lens; PCIOL posterior chamber intraocular lens; Phaco/IOL phacoemulsification with intraocular lens placement; PRK photorefractive keratectomy; LASIK laser assisted in situ keratomileusis; HTN hypertension; DM diabetes mellitus; COPD chronic obstructive pulmonary disease

## 2022-10-27 ENCOUNTER — Encounter (INDEPENDENT_AMBULATORY_CARE_PROVIDER_SITE_OTHER): Payer: Medicare PPO | Admitting: Ophthalmology
